# Patient Record
Sex: Female | Born: 1990 | Race: Black or African American | Hispanic: No | Marital: Single | Smoking: Never smoker
Health system: Southern US, Community
[De-identification: ages and names within clinical notes are randomized; demographics above are authoritative.]

---
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
## 2011-07-01 ENCOUNTER — Emergency Department (HOSPITAL_BASED_OUTPATIENT_CLINIC_OR_DEPARTMENT_OTHER)
Admission: EM | Admit: 2011-07-01 | Discharge: 2011-07-01 | Disposition: A | Discharge status: Home / Self Care | Payer: Medicaid Other | Attending: Emergency Medicine | Admitting: Emergency Medicine

## 2011-07-01 ENCOUNTER — Encounter (HOSPITAL_BASED_OUTPATIENT_CLINIC_OR_DEPARTMENT_OTHER): Payer: Self-pay | Admitting: Emergency Medicine

## 2011-07-01 DIAGNOSIS — J3489 Other specified disorders of nose and nasal sinuses: Secondary | ICD-10-CM | POA: Insufficient documentation

## 2011-07-01 DIAGNOSIS — J302 Other seasonal allergic rhinitis: Secondary | ICD-10-CM

## 2011-07-01 DIAGNOSIS — H5789 Other specified disorders of eye and adnexa: Secondary | ICD-10-CM | POA: Insufficient documentation

## 2011-07-01 DIAGNOSIS — J309 Allergic rhinitis, unspecified: Secondary | ICD-10-CM | POA: Insufficient documentation

## 2011-07-01 MED ORDER — CETIRIZINE HCL 5 MG/5ML PO SYRP
10.0000 mg | ORAL_SOLUTION | Freq: Once | ORAL | Status: AC
  Administered 2011-07-01: 10 mg via ORAL
  Filled 2011-07-01: qty 10

## 2011-07-01 NOTE — ED Notes (Signed)
Pt c/o allergy symptoms with watery eyes and runny nose since this morning. Pt states she has taken benadryl without relief.

## 2011-07-01 NOTE — ED Notes (Signed)
Dr. Molpus at bedside. 

## 2011-07-01 NOTE — ED Provider Notes (Signed)
History     CSN: [id number]  Arrival date & time 07/01/11  0114   None     Chief Complaint  Patient presents with  . Allergy symptoms     (Consider location/radiation/quality/duration/timing/severity/associated sxs/prior treatment) HPI This is a 21 year old black female with a history of seasonal allergies. She's been having allergy symptoms since yesterday morning. Specifically she complains of watery eyes and rhinorrhea. She denies sore throat, cough or wheezing. She took 2 Benadryl yesterday morning without relief.  History reviewed. No pertinent past medical history.  History reviewed. No pertinent past surgical history.  No family history on file.  History  Substance Use Topics  . Smoking status: Never Smoker   . Smokeless tobacco: Not on file  . Alcohol Use: No    OB History    Grav Para Term Preterm Abortions TAB SAB Ect Mult Living                  Review of Systems  All other systems reviewed and are negative.    Allergies  Review of patient's allergies indicates no known allergies.  Home Medications  No current outpatient prescriptions on file.  BP 110/71  Pulse 87  Temp(Src) 98.2 F (36.8 C) (Oral)  Resp 18  Ht 5' (1.524 m)  Wt 125 lb (56.7 kg)  BMI 24.41 kg/m2  SpO2 100%  Physical Exam General: Well-developed, well-nourished female in no acute distress; appearance consistent with age of record HENT: normocephalic, atraumatic; rhinorrhea Eyes: pupils equal round and reactive to light; extraocular muscles intact; no conjunctival inflammation or exudate Neck: supple Heart: regular rate and rhythm Lungs: clear to auscultation bilaterally Abdomen: soft; nondistended; nontender Extremities: No deformity; full range of motion; pulses normal Neurologic: Awake, alert and oriented; motor function intact in all extremities and symmetric; no facial droop Skin: Warm and dry Psychiatric: Normal mood and affect    ED Course  Procedures (including  critical care time)     MDM          Hanley Seamen, MD 07/01/11 [phone]

## 2012-10-16 ENCOUNTER — Emergency Department (HOSPITAL_BASED_OUTPATIENT_CLINIC_OR_DEPARTMENT_OTHER)
Admission: EM | Admit: 2012-10-16 | Discharge: 2012-10-16 | Disposition: A | Discharge status: Home / Self Care | Payer: Self-pay | Attending: Emergency Medicine | Admitting: Emergency Medicine

## 2012-10-16 ENCOUNTER — Encounter (HOSPITAL_BASED_OUTPATIENT_CLINIC_OR_DEPARTMENT_OTHER): Payer: Self-pay | Admitting: *Deleted

## 2012-10-16 DIAGNOSIS — Z3202 Encounter for pregnancy test, result negative: Secondary | ICD-10-CM | POA: Insufficient documentation

## 2012-10-16 DIAGNOSIS — R1110 Vomiting, unspecified: Secondary | ICD-10-CM | POA: Insufficient documentation

## 2012-10-16 DIAGNOSIS — R197 Diarrhea, unspecified: Secondary | ICD-10-CM | POA: Insufficient documentation

## 2012-10-16 DIAGNOSIS — N390 Urinary tract infection, site not specified: Secondary | ICD-10-CM | POA: Insufficient documentation

## 2012-10-16 DIAGNOSIS — Z792 Long term (current) use of antibiotics: Secondary | ICD-10-CM | POA: Insufficient documentation

## 2012-10-16 LAB — URINALYSIS, ROUTINE W REFLEX MICROSCOPIC
Bilirubin Urine: NEGATIVE
Specific Gravity, Urine: 1.028 (ref 1.005–1.030)
Urobilinogen, UA: 1 mg/dL (ref 0.0–1.0)
pH: 7.5 (ref 5.0–8.0)

## 2012-10-16 LAB — URINE MICROSCOPIC-ADD ON

## 2012-10-16 MED ORDER — SULFAMETHOXAZOLE-TRIMETHOPRIM 800-160 MG PO TABS: 1.0000 | ORAL_TABLET | Freq: Two times a day (BID) | ORAL | Status: DC

## 2012-10-16 NOTE — ED Provider Notes (Signed)
CSN: [id number]     Arrival date & time 10/16/12  1952 History  This chart was scribed for Vida Roller, MD by Leone Payor, ED Scribe. This patient was seen in room MH07/MH07 and the patient's care was started 9:10 PM.    Chief Complaint  Patient presents with  . Abdominal Pain    The history is provided by the patient. No language interpreter was used.    HPI Comments: Maecie Sevcik is a 22 y.o. female who presents to the Emergency Department complaining of improved left sided abdominal pain that began today after eating [employer]. Pt has chronic constipation since childhood but denies taking regular laxatives currently. She had an episode of emesis and 2 episodes of diarrhea after eating the food. She denies blood in stools, dysuria, frequency, leg swelling, cough, fever, chills.    History reviewed. No pertinent past medical history. History reviewed. No pertinent past surgical history. No family history on file. History  Substance Use Topics  . Smoking status: Never Smoker   . Smokeless tobacco: Not on file  . Alcohol Use: No   OB History   Grav Para Term Preterm Abortions TAB SAB Ect Mult Living                 Review of Systems A complete 10 system review of systems was obtained and all systems are negative except as noted in the HPI and PMH.   Allergies  Review of patient's allergies indicates no known allergies.  Home Medications   Current Outpatient Rx  Name  Route  Sig  Dispense  Refill  . sulfamethoxazole-trimethoprim (SEPTRA DS) 800-160 MG per tablet   Oral   Take 1 tablet by mouth 2 (two) times daily.   6 tablet   0    LMP 10/15/2012 Physical Exam  Nursing note and vitals reviewed. Constitutional: She is oriented to person, place, and time. She appears well-developed and well-nourished. No distress.  HENT:  Head: Normocephalic and atraumatic.  Mouth/Throat: Oropharynx is clear and moist.  Eyes: Conjunctivae are normal. Pupils are equal, round, and  reactive to light. No scleral icterus.  Neck: Neck supple.  Cardiovascular: Normal rate, regular rhythm, normal heart sounds and intact distal pulses.   No murmur heard. Pulmonary/Chest: Effort normal and breath sounds normal. No stridor. No respiratory distress. She has no rales.  Abdominal: Soft. Bowel sounds are normal. She exhibits no distension. There is no tenderness.  Musculoskeletal: Normal range of motion.  Neurological: She is alert and oriented to person, place, and time.  Skin: Skin is warm and dry. No rash noted.  Psychiatric: She has a normal mood and affect. Her behavior is normal.    ED Course  Procedures (including critical care time)  DIAGNOSTIC STUDIES: None performed.   COORDINATION OF CARE: 9:16 PM Discussed treatment plan with pt at bedside and pt agreed to plan.   Labs Review Labs Reviewed  URINALYSIS, ROUTINE W REFLEX MICROSCOPIC - Abnormal; Notable for the following:    Color, Urine RED (*)    APPearance TURBID (*)    Hgb urine dipstick LARGE (*)    Ketones, ur 15 (*)    Protein, ur 30 (*)    Nitrite POSITIVE (*)    Leukocytes, UA MODERATE (*)    All other components within normal limits  PREGNANCY, URINE  URINE MICROSCOPIC-ADD ON   Imaging Review No results found.  MDM   1. UTI (lower urinary tract infection)    Normal exam, has no  ttp, ua borderling UTI, stable for d/c.  No RLQ ttp.  Meds given in ED:  Medications - No data to display  New Prescriptions   SULFAMETHOXAZOLE-TRIMETHOPRIM (SEPTRA DS) 800-160 MG PER TABLET    Take 1 tablet by mouth 2 (two) times daily.    I personally performed the services described in this documentation, which was scribed in my presence. The recorded information has been reviewed and is accurate.      Vida Roller, MD 10/16/12 2131

## 2012-10-16 NOTE — ED Notes (Signed)
Abdominal cramping pain. Pain started after eating at [facility]. Diarrhea x 2 vomiting x 1.

## 2013-07-11 ENCOUNTER — Emergency Department (HOSPITAL_BASED_OUTPATIENT_CLINIC_OR_DEPARTMENT_OTHER): Payer: Medicaid Other

## 2013-07-11 ENCOUNTER — Emergency Department (HOSPITAL_BASED_OUTPATIENT_CLINIC_OR_DEPARTMENT_OTHER)
Admission: EM | Admit: 2013-07-11 | Discharge: 2013-07-11 | Disposition: A | Discharge status: Home / Self Care | Payer: Medicaid Other | Attending: Emergency Medicine | Admitting: Emergency Medicine

## 2013-07-11 ENCOUNTER — Encounter (HOSPITAL_BASED_OUTPATIENT_CLINIC_OR_DEPARTMENT_OTHER): Payer: Self-pay | Admitting: Emergency Medicine

## 2013-07-11 DIAGNOSIS — S0993XA Unspecified injury of face, initial encounter: Secondary | ICD-10-CM | POA: Insufficient documentation

## 2013-07-11 DIAGNOSIS — Z792 Long term (current) use of antibiotics: Secondary | ICD-10-CM | POA: Insufficient documentation

## 2013-07-11 DIAGNOSIS — W1809XA Striking against other object with subsequent fall, initial encounter: Secondary | ICD-10-CM | POA: Insufficient documentation

## 2013-07-11 DIAGNOSIS — S298XXA Other specified injuries of thorax, initial encounter: Secondary | ICD-10-CM | POA: Insufficient documentation

## 2013-07-11 DIAGNOSIS — M542 Cervicalgia: Secondary | ICD-10-CM

## 2013-07-11 DIAGNOSIS — S199XXA Unspecified injury of neck, initial encounter: Secondary | ICD-10-CM

## 2013-07-11 DIAGNOSIS — R0781 Pleurodynia: Secondary | ICD-10-CM

## 2013-07-11 DIAGNOSIS — Y929 Unspecified place or not applicable: Secondary | ICD-10-CM | POA: Insufficient documentation

## 2013-07-11 DIAGNOSIS — Z3202 Encounter for pregnancy test, result negative: Secondary | ICD-10-CM | POA: Insufficient documentation

## 2013-07-11 DIAGNOSIS — Y9389 Activity, other specified: Secondary | ICD-10-CM | POA: Insufficient documentation

## 2013-07-11 DIAGNOSIS — R55 Syncope and collapse: Secondary | ICD-10-CM | POA: Insufficient documentation

## 2013-07-11 DIAGNOSIS — N390 Urinary tract infection, site not specified: Secondary | ICD-10-CM | POA: Insufficient documentation

## 2013-07-11 LAB — URINALYSIS, ROUTINE W REFLEX MICROSCOPIC
Bilirubin Urine: NEGATIVE
GLUCOSE, UA: NEGATIVE mg/dL
HGB URINE DIPSTICK: NEGATIVE
Ketones, ur: NEGATIVE mg/dL
Nitrite: POSITIVE — AB
PH: 5.5 (ref 5.0–8.0)
Protein, ur: NEGATIVE mg/dL
SPECIFIC GRAVITY, URINE: 1.029 (ref 1.005–1.030)
Urobilinogen, UA: 0.2 mg/dL (ref 0.0–1.0)

## 2013-07-11 LAB — CBC
HCT: 38.4 % (ref 36.0–46.0)
Hemoglobin: 13.4 g/dL (ref 12.0–15.0)
MCH: 31.6 pg (ref 26.0–34.0)
MCHC: 34.9 g/dL (ref 30.0–36.0)
MCV: 90.6 fL (ref 78.0–100.0)
PLATELETS: 319 10*3/uL (ref 150–400)
RBC: 4.24 MIL/uL (ref 3.87–5.11)
RDW: 13.3 % (ref 11.5–15.5)
WBC: 7.5 10*3/uL (ref 4.0–10.5)

## 2013-07-11 LAB — URINE MICROSCOPIC-ADD ON

## 2013-07-11 LAB — BASIC METABOLIC PANEL
BUN: 14 mg/dL (ref 6–23)
CALCIUM: 9.4 mg/dL (ref 8.4–10.5)
CO2: 22 meq/L (ref 19–32)
CREATININE: 0.8 mg/dL (ref 0.50–1.10)
Chloride: 105 mEq/L (ref 96–112)
GFR calc Af Amer: >90 mL/min (ref >90)
Glucose, Bld: 92 mg/dL (ref 70–99)
Potassium: 4.6 mEq/L (ref 3.7–5.3)
SODIUM: 141 meq/L (ref 137–147)

## 2013-07-11 LAB — CBG MONITORING, ED: Glucose-Capillary: 96 mg/dL (ref 70–99)

## 2013-07-11 LAB — PREGNANCY, URINE: PREG TEST UR: NEGATIVE

## 2013-07-11 MED ORDER — HYDROCODONE-ACETAMINOPHEN 5-325 MG PO TABS: 1.0000 | ORAL_TABLET | ORAL | Status: DC | PRN

## 2013-07-11 MED ORDER — CEPHALEXIN 500 MG PO CAPS: 500.0000 mg | ORAL_CAPSULE | Freq: Two times a day (BID) | ORAL | Status: DC

## 2013-07-11 NOTE — ED Provider Notes (Signed)
Medical screening examination/treatment/procedure(s) were performed by non-physician practitioner and as supervising physician I was immediately available for consultation/collaboration.   EKG Interpretation   Date/Time:  Wednesday Jul 11 2013 18:17:38 EDT Ventricular Rate:  77 PR Interval:  126 QRS Duration: 74 QT Interval:  344 QTC Calculation: 389 R Axis:   52 Text Interpretation:  Normal sinus rhythm with sinus arrhythmia Normal ECG  No previous tracing Confirmed by Anitra Lauth  MD, Alphonzo Lemmings ([id number]) on  07/11/2013 6:59:56 PM        Gwyneth Sprout, MD 07/11/13 3646

## 2013-07-11 NOTE — ED Notes (Signed)
Pt "passed out" at work 40 min ago. Paramedic told her she had low bs - 79.  Pt sts she used to pass out sometimes when she was younger but has not done it in a while - 2 1/2 yrs ago.  Fell to floor and hit the back of her head on floor.  Skin intact. Sts she is sore all over.  Walks with brisk gait.  Alert and oriented.

## 2013-07-11 NOTE — ED Provider Notes (Signed)
CSN: [id number]     Arrival date & time 07/11/13  1712 History   First MD Initiated Contact with Patient 07/11/13 1834     Chief Complaint  Patient presents with  . Loss of Consciousness     (Consider location/radiation/quality/duration/timing/severity/associated sxs/prior Treatment) HPI Comments: Pt states that she had a syncopal episode at work. Pt states that she has done this before and she has been seen by a cardiologist and they haven't found anything. States that last time it happened was 2.5 years ago. Pt states that she sort of knew that it was coming and the next thing she knew people were standing over her. Pt states that she had a really bad headache initially but now it is getting better. She states that she came in because she is sore from falling:unsure of how long she was out  The history is provided by the patient. No language interpreter was used.    History reviewed. No pertinent past medical history. History reviewed. No pertinent past surgical history. No family history on file. History  Substance Use Topics  . Smoking status: Never Smoker   . Smokeless tobacco: Not on file  . Alcohol Use: No   OB History   Grav Para Term Preterm Abortions TAB SAB Ect Mult Living                 Review of Systems  Constitutional: Negative.   Respiratory: Negative.   Cardiovascular: Negative.       Allergies  Review of patient's allergies indicates no known allergies.  Home Medications   Prior to Admission medications   Medication Sig Start Date End Date Taking? Authorizing Provider  sulfamethoxazole-trimethoprim (SEPTRA DS) 800-160 MG per tablet Take 1 tablet by mouth 2 (two) times daily. 10/16/12   Vida Roller, MD   BP 111/66  Pulse 80  Temp(Src) 98.7 F (37.1 C) (Oral)  Resp 16  Ht 5' (1.524 m)  Wt 120 lb (54.432 kg)  BMI 23.44 kg/m2  SpO2 100%  LMP 06/18/2013 Physical Exam  Nursing note and vitals reviewed. Constitutional: She is oriented to person,  place, and time. She appears well-developed and well-nourished.  HENT:  Left Ear: External ear normal.  Eyes: Conjunctivae and EOM are normal. Pupils are equal, round, and reactive to light.  Cardiovascular: Normal rate and regular rhythm.   Pulmonary/Chest: Effort normal and breath sounds normal.  Right posterior rib pain  Abdominal: Soft. Bowel sounds are normal. There is no tenderness.  Musculoskeletal: Normal range of motion.  Neurological: She is alert and oriented to person, place, and time.  Skin: Skin is warm and dry.  Psychiatric: She has a normal mood and affect.    ED Course  Procedures (including critical care time) Labs Review Labs Reviewed  URINALYSIS, ROUTINE W REFLEX MICROSCOPIC - Abnormal; Notable for the following:    APPearance CLOUDY (*)    Nitrite POSITIVE (*)    Leukocytes, UA MODERATE (*)    All other components within normal limits  URINE MICROSCOPIC-ADD ON - Abnormal; Notable for the following:    Bacteria, UA MANY (*)    All other components within normal limits  PREGNANCY, URINE  CBC  BASIC METABOLIC PANEL  CBG MONITORING, ED    Imaging Review Dg Ribs Unilateral W/chest Left  07/11/2013   CLINICAL DATA:  Syncope.  Right lateral rib pain.  EXAM: LEFT RIBS AND CHEST - 3+ VIEW  COMPARISON:  None.  FINDINGS: Frontal view of the chest shows midline  trachea and normal heart size. Lungs are clear. No pleural fluid.  Dedicated views of the right ribs show no acute fracture.  IMPRESSION: Negative.   Electronically Signed   By: Leanna BattlesMelinda  Blietz M.D.   On: 07/11/2013 19:39   Dg Cervical Spine Complete  07/11/2013   CLINICAL DATA:  Syncope with posterior cervical spine pain.  EXAM: CERVICAL SPINE  4+ VIEWS  COMPARISON:  None.  FINDINGS: The cervical spine is visualized from the occiput to the cervicothoracic junction. Alignment is anatomic. Vertebral body height is maintained. Prevertebral soft tissues are within normal limits. No degenerative changes. Neural  foramina are patent. Visualized lung apices are clear.  IMPRESSION: Negative.   Electronically Signed   By: Leanna BattlesMelinda  Blietz M.D.   On: 07/11/2013 19:40     EKG Interpretation None      MDM   Final diagnoses:  Syncope  UTI (lower urinary tract infection)  Rib pain  Neck pain    Pt is not symptomatic at this time and is feeling better. Will treat for uti. Will treat with pain medicationf or myalgias.    Teressa LowerVrinda Minnetta Sandora, NP 07/11/13 2053

## 2013-07-11 NOTE — ED Notes (Signed)
np at bedside

## 2013-07-11 NOTE — Discharge Instructions (Signed)
Urinary Tract Infection A urinary tract infection (UTI) can occur any place along the urinary tract. The tract includes the kidneys, ureters, bladder, and urethra. A type of germ called bacteria often causes a UTI. UTIs are often helped with antibiotic medicine.  HOME CARE   If given, take antibiotics as told by your doctor. Finish them even if you start to feel better.  Drink enough fluids to keep your pee (urine) clear or pale yellow.  Avoid tea, drinks with caffeine, and bubbly (carbonated) drinks.  Pee often. Avoid holding your pee in for a long time.  Pee before and after having sex (intercourse).  Wipe from front to back after you poop (bowel movement) if you are a woman. Use each tissue only once. GET HELP RIGHT AWAY IF:   You have back pain.  You have lower belly (abdominal) pain.  You have chills.  You feel sick to your stomach (nauseous).  You throw up (vomit).  Your burning or discomfort with peeing does not go away.  You have a fever.  Your symptoms are not better in 3 days. MAKE SURE YOU:   Understand these instructions.  Will watch your condition.  Will get help right away if you are not doing well or get worse. Document Released: 07/21/2007 Document Revised: 10/27/2011 Document Reviewed: 09/02/2011 [facility] Patient Information 2014 [place], [place].  Syncope Syncope means a person passes out (faints). The person usually wakes up in less than 5 minutes. It is important to seek medical care for syncope. HOME CARE  Have someone stay with you until you feel normal.  Do not drive, use machines, or play sports until your doctor says it is okay.  Keep all doctor visits as told.  Lie down when you feel like you might pass out. Take deep breaths. Wait until you feel normal before standing up.  Drink enough fluids to keep your pee (urine) clear or pale yellow.  If you take blood pressure or heart medicine, get up slowly. Take several minutes to sit and then  stand. GET HELP RIGHT AWAY IF:   You have a severe headache.  You have pain in the chest, belly (abdomen), or back.  You are bleeding from the mouth or butt (rectum).  You have black or tarry poop (stool).  You have an irregular or very fast heartbeat.  You have pain with breathing.  You keep passing out, or you have shaking (seizures) when you pass out.  You pass out when sitting or lying down.  You feel confused.  You have trouble walking.  You have severe weakness.  You have vision problems. If you fainted, call your local emergency services (911 in [place] Do not drive yourself to the hospital. MAKE SURE YOU:   Understand these instructions.  Will watch your condition.  Will get help right away if you are not doing well or get worse. Document Released: 07/21/2007 Document Revised: 08/03/2011 Document Reviewed: 04/02/2011 [facility] Patient Information 2014 [place], [place].

## 2013-09-14 ENCOUNTER — Emergency Department (HOSPITAL_COMMUNITY)
Admission: EM | Admit: 2013-09-14 | Discharge: 2013-09-14 | Disposition: A | Discharge status: Home / Self Care | Payer: Medicaid Other | Attending: Emergency Medicine | Admitting: Emergency Medicine

## 2013-09-14 ENCOUNTER — Encounter (HOSPITAL_COMMUNITY): Payer: Self-pay | Admitting: Emergency Medicine

## 2013-09-14 DIAGNOSIS — Z3202 Encounter for pregnancy test, result negative: Secondary | ICD-10-CM | POA: Insufficient documentation

## 2013-09-14 DIAGNOSIS — R42 Dizziness and giddiness: Secondary | ICD-10-CM | POA: Insufficient documentation

## 2013-09-14 DIAGNOSIS — J029 Acute pharyngitis, unspecified: Secondary | ICD-10-CM

## 2013-09-14 DIAGNOSIS — R52 Pain, unspecified: Secondary | ICD-10-CM | POA: Insufficient documentation

## 2013-09-14 LAB — RAPID STREP SCREEN (MED CTR MEBANE ONLY): Streptococcus, Group A Screen (Direct): NEGATIVE

## 2013-09-14 LAB — CBC WITH DIFFERENTIAL/PLATELET
Basophils Absolute: 0 10*3/uL (ref 0.0–0.1)
Basophils Relative: 0 % (ref 0–1)
Eosinophils Absolute: 0 10*3/uL (ref 0.0–0.7)
Eosinophils Relative: 0 % (ref 0–5)
HCT: 39 % (ref 36.0–46.0)
Hemoglobin: 13.4 g/dL (ref 12.0–15.0)
Lymphocytes Relative: 9 % — ABNORMAL LOW (ref 12–46)
Lymphs Abs: 1.1 10*3/uL (ref 0.7–4.0)
MCH: 30.8 pg (ref 26.0–34.0)
MCHC: 34.4 g/dL (ref 30.0–36.0)
MCV: 89.7 fL (ref 78.0–100.0)
Monocytes Absolute: 1.4 10*3/uL — ABNORMAL HIGH (ref 0.1–1.0)
Monocytes Relative: 12 % (ref 3–12)
Neutro Abs: 9.5 10*3/uL — ABNORMAL HIGH (ref 1.7–7.7)
Neutrophils Relative %: 79 % — ABNORMAL HIGH (ref 43–77)
Platelets: 327 10*3/uL (ref 150–400)
RBC: 4.35 MIL/uL (ref 3.87–5.11)
RDW: 13.3 % (ref 11.5–15.5)
WBC: 12.1 10*3/uL — ABNORMAL HIGH (ref 4.0–10.5)

## 2013-09-14 LAB — PREGNANCY, URINE: Preg Test, Ur: NEGATIVE

## 2013-09-14 LAB — URINALYSIS, ROUTINE W REFLEX MICROSCOPIC
Bilirubin Urine: NEGATIVE
Glucose, UA: NEGATIVE mg/dL
Ketones, ur: 15 mg/dL — AB
Leukocytes, UA: NEGATIVE
Nitrite: NEGATIVE
Protein, ur: NEGATIVE mg/dL
Specific Gravity, Urine: 1.007 (ref 1.005–1.030)
Urobilinogen, UA: 1 mg/dL (ref 0.0–1.0)
pH: 6.5 (ref 5.0–8.0)

## 2013-09-14 LAB — BASIC METABOLIC PANEL
Anion gap: 12 (ref 5–15)
BUN: 6 mg/dL (ref 6–23)
CO2: 24 mEq/L (ref 19–32)
Calcium: 8.4 mg/dL (ref 8.4–10.5)
Chloride: 102 mEq/L (ref 96–112)
Creatinine, Ser: 0.82 mg/dL (ref 0.50–1.10)
GFR calc Af Amer: >90 mL/min (ref >90)
GFR calc non Af Amer: >90 mL/min (ref >90)
Glucose, Bld: 85 mg/dL (ref 70–99)
Potassium: 3.4 mEq/L — ABNORMAL LOW (ref 3.7–5.3)
Sodium: 138 mEq/L (ref 137–147)

## 2013-09-14 LAB — MONONUCLEOSIS SCREEN: Mono Screen: NEGATIVE

## 2013-09-14 LAB — URINE MICROSCOPIC-ADD ON

## 2013-09-14 MED ORDER — SODIUM CHLORIDE 0.9 % IV BOLUS (SEPSIS)
2000.0000 mL | Freq: Once | INTRAVENOUS | Status: AC
  Administered 2013-09-14: 1000 mL via INTRAVENOUS

## 2013-09-14 MED ORDER — ACETAMINOPHEN-CODEINE 120-12 MG/5ML PO SOLN: 10.0000 mL | ORAL | Status: AC | PRN

## 2013-09-14 MED ORDER — AMOXICILLIN-POT CLAVULANATE 875-125 MG PO TABS: 1.0000 | ORAL_TABLET | Freq: Two times a day (BID) | ORAL | Status: AC

## 2013-09-14 NOTE — ED Notes (Signed)
Patient states she has been having dizzy spells x 1 week.   Patient states she has been having generalized weakness and body aches x 1 week.   Patient also complains of sore throat.

## 2013-09-14 NOTE — Discharge Instructions (Signed)
Return here as needed.  Increase your fluid intake.  rest as much as possible °

## 2013-09-14 NOTE — ED Provider Notes (Signed)
CSN: [id number]     Arrival date & time 09/14/13  1106 History   First MD Initiated Contact with Patient 09/14/13 1108     Chief Complaint  Patient presents with  . Dizziness  . Sore Throat  . Generalized Body Aches     (Consider location/radiation/quality/duration/timing/severity/associated sxs/prior Treatment) HPI Patient presents to the emergency department with dizziness and sore throat over the last week.  The patient, states she's also had body aches.  Patient, states she did not take any medications prior to arrival.  Patient denies chest pain, shortness of breath, weakness, dizziness, fever, headache, blurred vision, back pain, dysuria, cough, wheezing, rash or syncope.  The patient, states, that History reviewed. No pertinent past medical history. History reviewed. No pertinent past surgical history. No family history on file. History  Substance Use Topics  . Smoking status: Never Smoker   . Smokeless tobacco: Not on file  . Alcohol Use: No   OB History   Grav Para Term Preterm Abortions TAB SAB Ect Mult Living                 Review of Systems  Constitutional: Negative for diaphoresis, activity change and appetite change.  HENT: Positive for sore throat. Negative for congestion and dental problem.   Eyes: Negative for discharge.  Musculoskeletal: Positive for myalgias. Negative for arthralgias.  Neurological: Negative for dizziness.  Hematological: Positive for adenopathy.  Psychiatric/Behavioral: Negative for confusion.      Allergies  Review of patient's allergies indicates no known allergies.  Home Medications   Prior to Admission medications   Medication Sig Start Date End Date Taking? Authorizing Provider  diphenhydrAMINE (BENADRYL) 25 MG tablet Take 25 mg by mouth every 6 (six) hours as needed for allergies.   Yes Historical Provider, MD  guaifenesin (ROBITUSSIN) 100 MG/5ML syrup Take 200 mg by mouth 3 (three) times daily as needed for cough.   Yes  Historical Provider, MD   BP 96/55  Pulse 95  Temp(Src) 98.6 F (37 C) (Oral)  Resp 24  Ht 5' (1.524 m)  Wt 120 lb (54.432 kg)  BMI 23.44 kg/m2  SpO2 99%  LMP 09/14/2013 Physical Exam  Nursing note and vitals reviewed. Constitutional: She is oriented to person, place, and time. She appears well-developed and well-nourished. No distress.  HENT:  Head: Normocephalic and atraumatic.  Mouth/Throat: Uvula is midline and mucous membranes are normal. No trismus in the jaw. Normal dentition. No dental abscesses, uvula swelling or dental caries. Oropharyngeal exudate and posterior oropharyngeal erythema present. No posterior oropharyngeal edema.  Eyes: Pupils are equal, round, and reactive to light.  Neck: Normal range of motion. Neck supple.  Cardiovascular: Normal rate, regular rhythm and normal heart sounds.  Exam reveals no gallop and no friction rub.   No murmur heard. Pulmonary/Chest: Effort normal and breath sounds normal. No respiratory distress.  Abdominal: Soft. Bowel sounds are normal. She exhibits no distension. There is no tenderness.  Neurological: She is alert and oriented to person, place, and time. She exhibits normal muscle tone. Coordination normal.  Skin: Skin is warm and dry. No rash noted.  Psychiatric: She has a normal mood and affect.    ED Course  Procedures (including critical care time) Labs Review Labs Reviewed  CBC WITH DIFFERENTIAL - Abnormal; Notable for the following:    WBC 12.1 (*)    Neutrophils Relative % 79 (*)    Neutro Abs 9.5 (*)    Lymphocytes Relative 9 (*)    Monocytes  Absolute 1.4 (*)    All other components within normal limits  BASIC METABOLIC PANEL - Abnormal; Notable for the following:    Potassium 3.4 (*)    All other components within normal limits  URINALYSIS, ROUTINE W REFLEX MICROSCOPIC - Abnormal; Notable for the following:    Hgb urine dipstick LARGE (*)    Ketones, ur 15 (*)    All other components within normal limits   URINE MICROSCOPIC-ADD ON - Abnormal; Notable for the following:    Casts GRANULAR CAST (*)    All other components within normal limits  RAPID STREP SCREEN  CULTURE, GROUP A STREP  PREGNANCY, URINE  MONONUCLEOSIS SCREEN    Imaging Review No results found.   EKG Interpretation   Date/Time:  Friday September 14 2013 11:56:49 EDT Ventricular Rate:  105 PR Interval:  135 QRS Duration: 68 QT Interval:  304 QTC Calculation: 402 R Axis:   58 Text Interpretation:  Sinus tachycardia Low voltage, precordial leads  Since last tracing rate faster Confirmed by KNAPP  MD-J, JON ([id number]) on  09/14/2013 12:14:44 PM       Patient be treated for pharyngitis.  Told to return here as needed.  Told to increase her fluid intake, and rest as much possible  Carlyle Dolly, PA-C 09/16/13 1459

## 2013-09-16 LAB — CULTURE, GROUP A STREP

## 2013-09-17 NOTE — ED Provider Notes (Signed)
Medical screening examination/treatment/procedure(s) were performed by non-physician practitioner and as supervising physician I was immediately available for consultation/collaboration.   EKG Interpretation   Date/Time:  Friday September 14 2013 11:56:49 EDT Ventricular Rate:  105 PR Interval:  135 QRS Duration: 68 QT Interval:  304 QTC Calculation: 402 R Axis:   58 Text Interpretation:  Sinus tachycardia Low voltage, precordial leads  Since last tracing rate faster Confirmed by Yurem Viner  MD-J, Anola Mcgough ([id number]) on  09/14/2013 12:14:44 PM        Linwood DibblesJon Dayquan Buys, MD 09/17/13 [phone]

## 2015-09-23 IMAGING — CR DG CERVICAL SPINE COMPLETE 4+V
5 series · 5 of 5 positions shown · non-contrast
Comparison: None.

CLINICAL DATA: Syncope with posterior cervical spine pain.

EXAM:
CERVICAL SPINE  4+ VIEWS

[w c-spine a.p.]
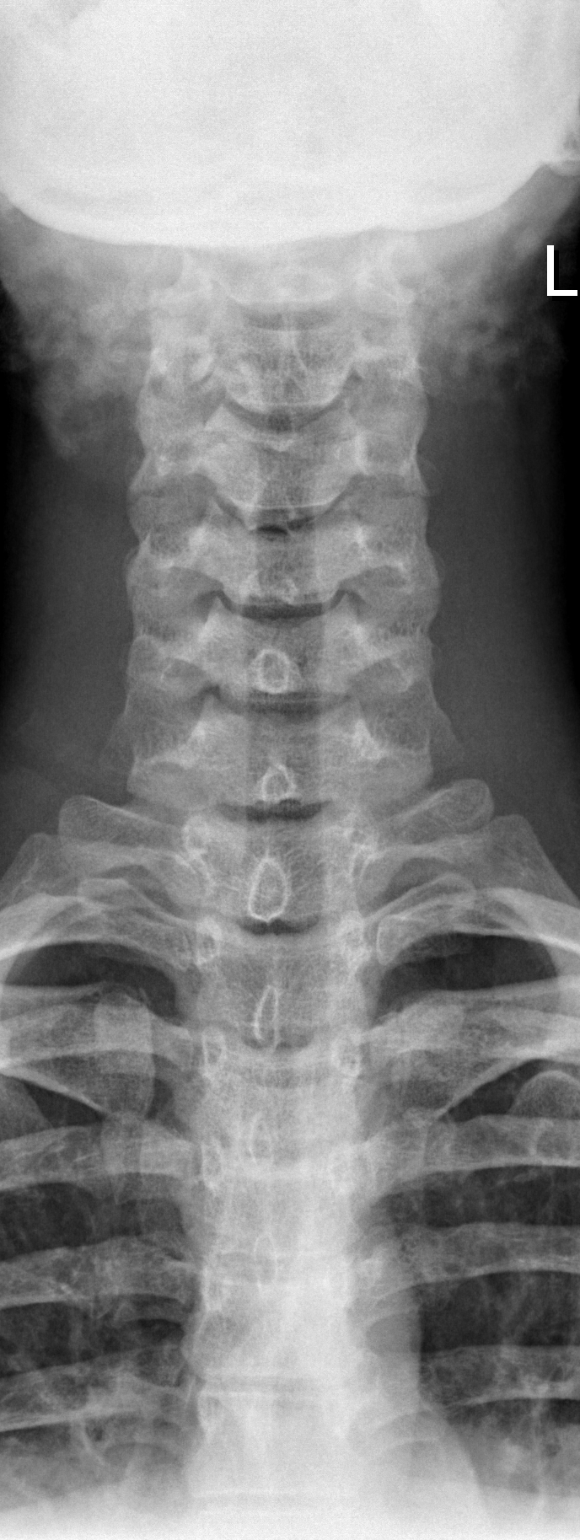

[w c-spine oblique (1 of 2)]
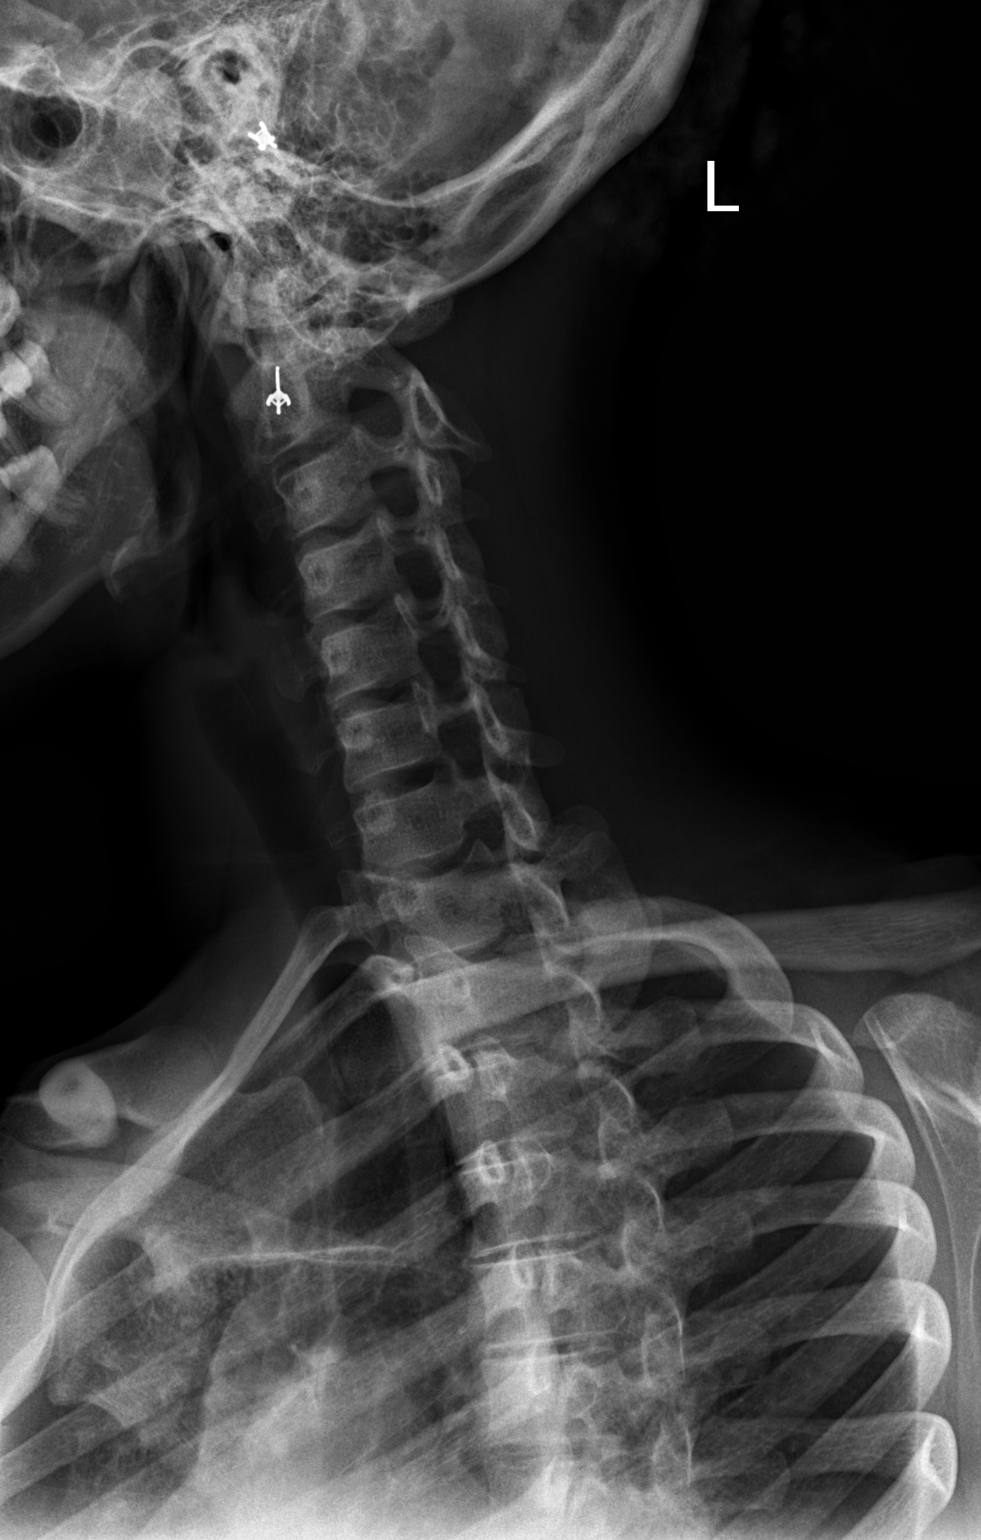

[w c-spine oblique (2 of 2)]
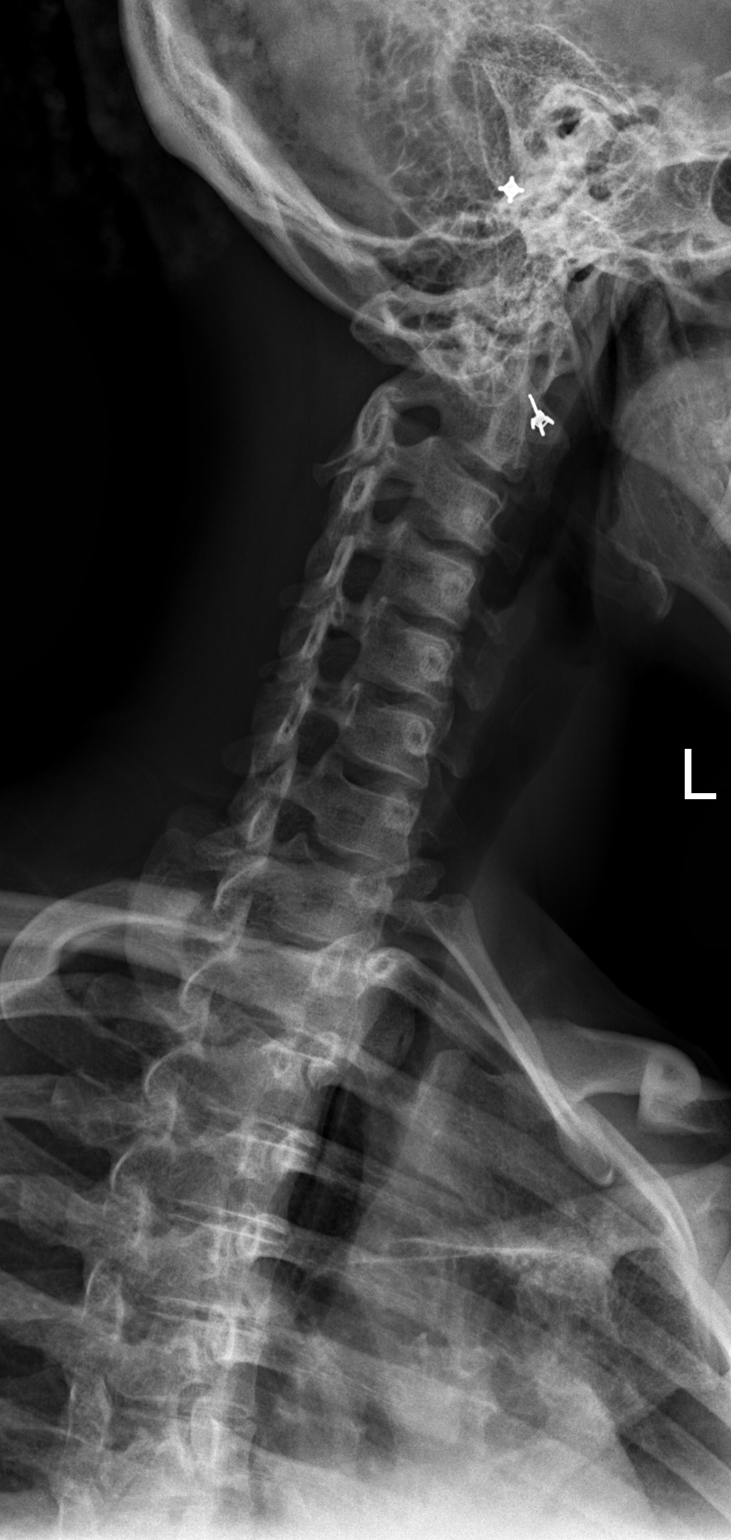

[w c-spine lat]
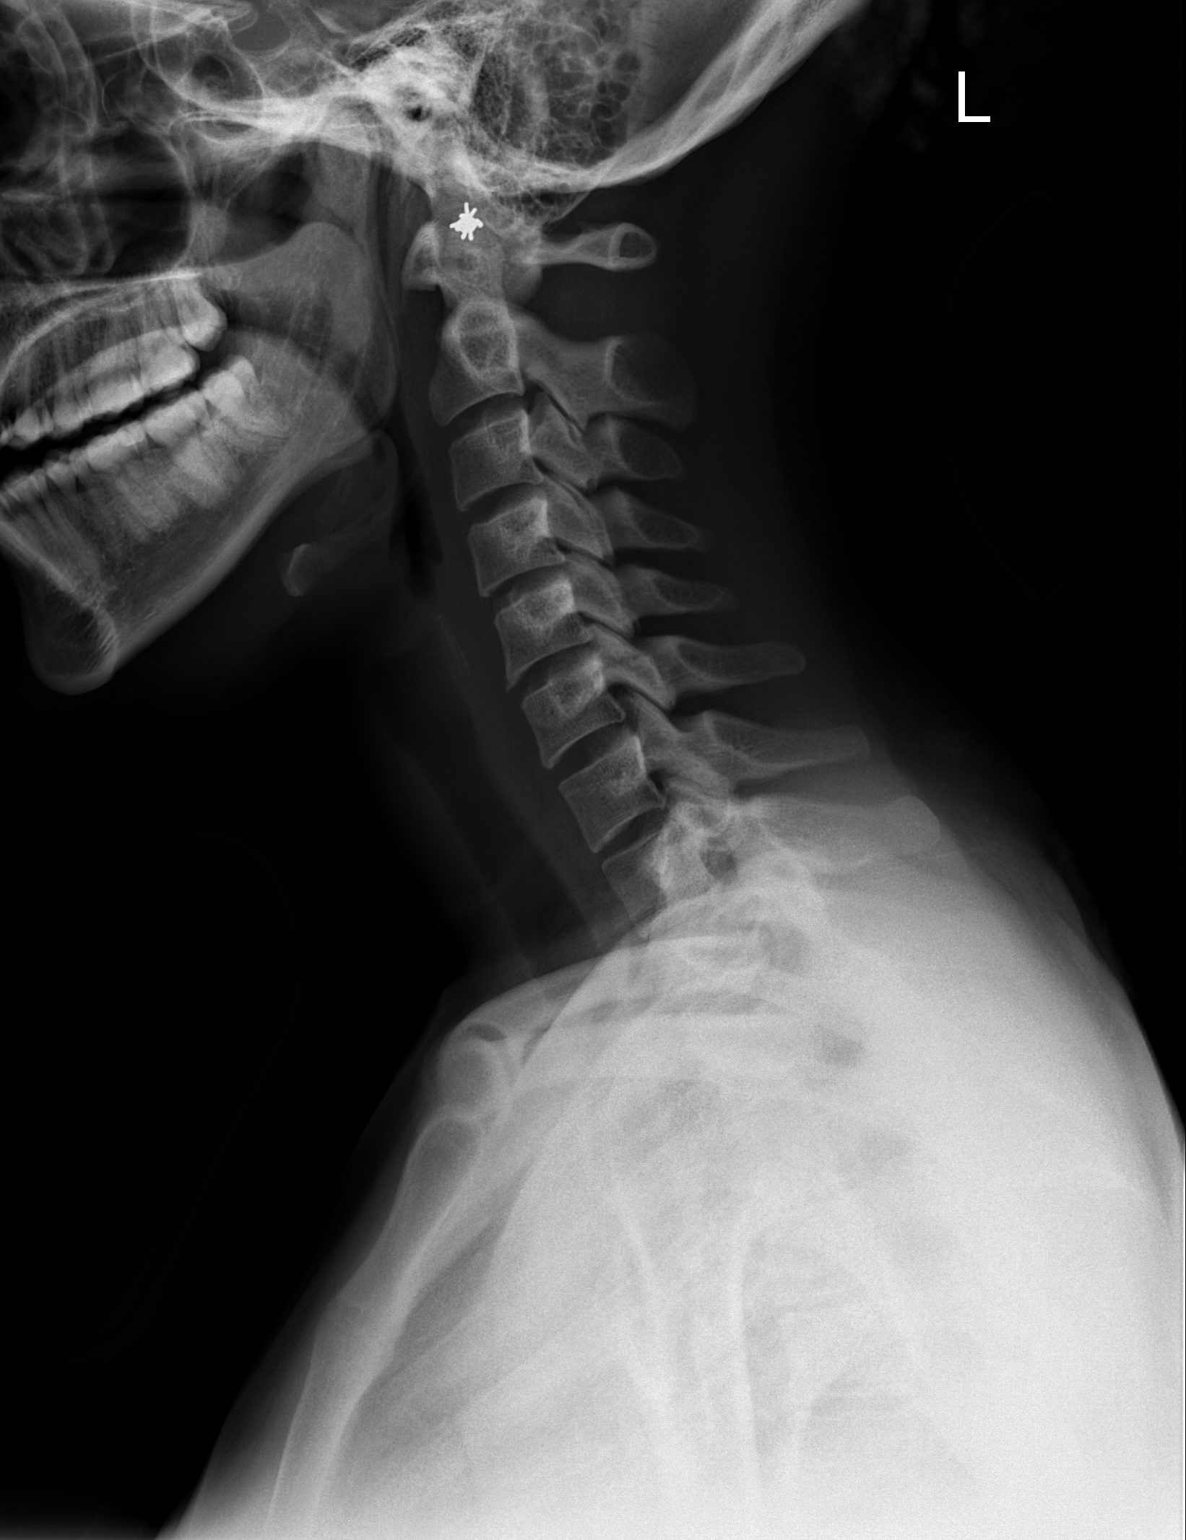

[w c-spine odontoid]
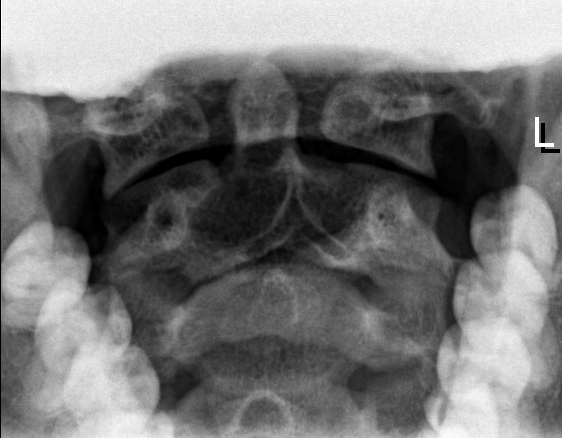

[5 of 5 positions shown; findings below may reference images not displayed]

FINDINGS: The cervical spine is visualized from the occiput to the
cervicothoracic junction. Alignment is anatomic. Vertebral body
height is maintained. Prevertebral soft tissues are within normal
limits. No degenerative changes. Neural foramina are patent.
Visualized lung apices are clear.
IMPRESSION: Negative.
